# Patient Record
Sex: Male | Born: 1965 | Race: Black or African American | Hispanic: No | Marital: Married | Smoking: Never smoker
Health system: Southern US, Community
[De-identification: ages and names within clinical notes are randomized; demographics above are authoritative.]

## PROBLEM LIST (undated history)

## (undated) HISTORY — PX: HAND SURGERY: SHX662

---

## 2013-01-07 ENCOUNTER — Emergency Department (INDEPENDENT_AMBULATORY_CARE_PROVIDER_SITE_OTHER)
Admission: EM | Admit: 2013-01-07 | Discharge: 2013-01-07 | Disposition: A | Discharge status: Home / Self Care | Payer: Self-pay | Source: Home / Self Care | Attending: Family Medicine | Admitting: Family Medicine

## 2013-01-07 ENCOUNTER — Encounter (HOSPITAL_COMMUNITY): Payer: Self-pay | Admitting: Emergency Medicine

## 2013-01-07 DIAGNOSIS — K2970 Gastritis, unspecified, without bleeding: Secondary | ICD-10-CM

## 2013-01-07 DIAGNOSIS — K2990 Gastroduodenitis, unspecified, without bleeding: Secondary | ICD-10-CM

## 2013-01-07 LAB — POCT URINALYSIS DIP (DEVICE)
Bilirubin Urine: NEGATIVE
Ketones, ur: NEGATIVE mg/dL
Leukocytes, UA: NEGATIVE
Specific Gravity, Urine: 1.025 (ref 1.005–1.030)
pH: 6 (ref 5.0–8.0)

## 2013-01-07 MED ORDER — OMEPRAZOLE 20 MG PO CPDR: 20.0000 mg | DELAYED_RELEASE_CAPSULE | Freq: Every day | ORAL | Status: AC

## 2013-01-07 NOTE — ED Provider Notes (Signed)
Medical screening examination/treatment/procedure(s) were performed by non-physician practitioner and as supervising physician I was immediately available for consultation/collaboration.   MORENO-COLL,Rilynne Lonsway; MD   Syliva Mee Moreno-Coll, MD 01/07/13 1654 

## 2013-01-07 NOTE — ED Provider Notes (Signed)
History     CSN: [id number]  Arrival date & time 01/07/13  1243   None     No chief complaint on file.   (Consider location/radiation/quality/duration/timing/severity/associated sxs/prior treatment) Patient is a 47 y.o. male presenting with frequency. The history is provided by the patient. No language interpreter was used.  Urinary Frequency This is a new problem. The problem occurs constantly. The problem has been gradually worsening. Associated symptoms include abdominal pain. Nothing aggravates the symptoms. Nothing relieves the symptoms. He has tried nothing for the symptoms.  Pt complains of increased urinary frequency.   Pt reports he had a lot of belching earlier today.  Pt reports he had pain in upper abdomen that went into his chest.  History reviewed. No pertinent past medical history.  Past Surgical History  Procedure Date  . Hand surgery     No family history on file.  History  Substance Use Topics  . Smoking status: Never Smoker   . Smokeless tobacco: Not on file  . Alcohol Use: No      Review of Systems  Gastrointestinal: Positive for abdominal pain.  Genitourinary: Positive for frequency.  All other systems reviewed and are negative.    Allergies  Review of patient's allergies indicates no known allergies.  Home Medications   Current Outpatient Rx  Name  Route  Sig  Dispense  Refill  . OVER THE COUNTER MEDICATION      Over the counter antacids           BP 120/83  Pulse 72  Temp 98 F (36.7 C) (Oral)  Resp 18  SpO2 99%  Physical Exam  Nursing note and vitals reviewed. Constitutional: He appears well-developed and well-nourished.  HENT:  Head: Normocephalic.  Eyes: Conjunctivae normal and EOM are normal. Pupils are equal, round, and reactive to light.  Neck: Normal range of motion. Neck supple.  Cardiovascular: Normal rate, regular rhythm and normal heart sounds.   Pulmonary/Chest: Effort normal and breath sounds normal.    Abdominal: Soft.  Musculoskeletal: Normal range of motion.  Neurological: He is alert.  Skin: Skin is warm.    ED Course  Procedures (including critical care time)  Labs Reviewed - No data to display No results found.   No diagnosis found.    MDM    prilosec      Lonia Skinner [place], [place] 01/07/13 [phone]

## 2013-01-07 NOTE — ED Notes (Signed)
Reports belching frequently for a month.  Reports frequent urination, urgency.  Reports nausea the last few days.  Felt pressure under left breast.  Denies sob, reports cool, clammy feeling, no vomiting, some nausea.

## 2013-01-07 NOTE — ED Notes (Signed)
Warm blankets/pillow.  Patient is in a gown

## 2013-01-07 NOTE — ED Notes (Signed)
Patient instructed to place on gown, provided blankets

## 2013-01-07 NOTE — ED Notes (Signed)
Patient is resting comfortably.  Updated on physician to be returning to room, results back

## 2013-01-07 NOTE — ED Notes (Signed)
Patient is resting comfortably. 

## 2013-01-07 NOTE — ED Notes (Signed)
Patient getting dressed.

## 2013-03-05 ENCOUNTER — Emergency Department (INDEPENDENT_AMBULATORY_CARE_PROVIDER_SITE_OTHER)
Admission: EM | Admit: 2013-03-05 | Discharge: 2013-03-05 | Disposition: A | Discharge status: Home / Self Care | Payer: Self-pay | Source: Home / Self Care | Attending: Family Medicine | Admitting: Family Medicine

## 2013-03-05 ENCOUNTER — Encounter (HOSPITAL_COMMUNITY): Payer: Self-pay | Admitting: Emergency Medicine

## 2013-03-05 DIAGNOSIS — R29898 Other symptoms and signs involving the musculoskeletal system: Secondary | ICD-10-CM

## 2013-03-05 DIAGNOSIS — M25539 Pain in unspecified wrist: Secondary | ICD-10-CM

## 2013-03-05 DIAGNOSIS — M25531 Pain in right wrist: Secondary | ICD-10-CM

## 2013-03-05 NOTE — ED Provider Notes (Signed)
History     CSN: [id number]  Arrival date & time 03/05/13  1528   First MD Initiated Contact with Patient 03/05/13 1728      Chief Complaint  Patient presents with  . Wrist Pain    (Consider location/radiation/quality/duration/timing/severity/associated sxs/prior treatment) HPI Comments: Pt reports he shot a nail through the anterior of his R wrist in 2011 with a nail gun.  Had it removed but never sought follow up.  It has been causing him pain on and off with active use of wrist that is getting worse.  And now pt describes wrist weakness, not being able to use wrist as he can the L.  Also reports sometimes R hand feels numb or tingly.  Pt reports sx are getting more frequent and worse, and are interfering with his job performance.   Patient is a 47 y.o. male presenting with wrist pain. The history is provided by the patient.  Wrist Pain This is a chronic problem. Episode onset: 3 years ago. The problem occurs every several days. The problem has been gradually worsening. The symptoms are aggravated by exertion. The symptoms are relieved by rest. He has tried rest for the symptoms. Improvement on treatment: transient relief.    History reviewed. No pertinent past medical history.  Past Surgical History  Procedure Laterality Date  . Hand surgery      History reviewed. No pertinent family history.  History  Substance Use Topics  . Smoking status: Never Smoker   . Smokeless tobacco: Not on file  . Alcohol Use: No      Review of Systems  Constitutional: Negative for fever and chills.  Musculoskeletal:       Wrist pain  Skin: Negative for color change and wound.  Neurological: Positive for weakness and numbness.    Allergies  Review of patient's allergies indicates no known allergies.  Home Medications   Current Outpatient Rx  Name  Route  Sig  Dispense  Refill  . omeprazole (PRILOSEC) 20 MG capsule   Oral   Take 1 capsule (20 mg total) by mouth daily.   30  capsule   0   . OVER THE COUNTER MEDICATION      Over the counter antacids           BP 117/75  Pulse 74  Temp(Src) 98.3 F (36.8 C) (Oral)  Resp 12  SpO2 100%  Physical Exam  Constitutional: He appears well-developed and well-nourished. No distress.  Cardiovascular:  Pulses:      Radial pulses are 2+ on the right side.  Musculoskeletal:       Right wrist: Normal.  Neurological: He has normal strength. No sensory deficit.  Skin: Skin is warm, dry and intact.    ED Course  Procedures (including critical care time)  Labs Reviewed - No data to display No results found.   1. Wrist pain, right   2. Wrist weakness       MDM  I find no abnormalities on exam today, but given pt's sx and hx of injury, will refer to hand for further eval.         Cathlyn Parsons, NP 03/05/13 1742

## 2013-03-05 NOTE — ED Notes (Addendum)
Pt C/o pain in right wrist associated to a previous injury. Pain onset in January but is getting worse due to his line of work. Pain radiates from wrist to elbows. No numbness or tingling in fingers. Taking OTC Aleve with relief. No fever. Patient is alert and oriented.

## 2013-03-09 NOTE — ED Provider Notes (Signed)
Medical screening examination/treatment/procedure(s) were performed by resident physician or non-physician practitioner and as supervising physician I was immediately available for consultation/collaboration.   Bhakti Labella DOUGLAS MD.   Kenlee Vogt D Mady Oubre, MD 03/09/13 1017 

## 2021-03-11 ENCOUNTER — Emergency Department (HOSPITAL_COMMUNITY): Payer: BLUE CROSS/BLUE SHIELD

## 2021-03-11 ENCOUNTER — Emergency Department (HOSPITAL_COMMUNITY)
Admission: EM | Admit: 2021-03-11 | Discharge: 2021-03-11 | Disposition: A | Discharge status: Home / Self Care | Payer: BLUE CROSS/BLUE SHIELD | Attending: Emergency Medicine | Admitting: Emergency Medicine

## 2021-03-11 ENCOUNTER — Other Ambulatory Visit: Payer: Self-pay

## 2021-03-11 DIAGNOSIS — N433 Hydrocele, unspecified: Secondary | ICD-10-CM | POA: Insufficient documentation

## 2021-03-11 DIAGNOSIS — N400 Enlarged prostate without lower urinary tract symptoms: Secondary | ICD-10-CM

## 2021-03-11 DIAGNOSIS — N50812 Left testicular pain: Secondary | ICD-10-CM

## 2021-03-11 LAB — URINALYSIS, ROUTINE W REFLEX MICROSCOPIC
Bilirubin Urine: NEGATIVE
Glucose, UA: NEGATIVE mg/dL
Hgb urine dipstick: NEGATIVE
Ketones, ur: NEGATIVE mg/dL
Leukocytes,Ua: NEGATIVE
Nitrite: NEGATIVE
Protein, ur: NEGATIVE mg/dL
Specific Gravity, Urine: 1.018 (ref 1.005–1.030)
pH: 5 (ref 5.0–8.0)

## 2021-03-11 MED ORDER — CELECOXIB 200 MG PO CAPS: 200.0000 mg | ORAL_CAPSULE | Freq: Two times a day (BID) | ORAL | 0 refills | Status: AC

## 2021-03-11 NOTE — ED Provider Notes (Signed)
[facility] EMERGENCY DEPARTMENT Provider Note   CSN: [id number] Arrival date & time: 03/11/21  8250     History Chief Complaint  Patient presents with  . Testicle Pain    Ryan Hooper is a 55 y.o. male.  Pt here with R testicle pain. Onset 6 days ago. Constant, moderate, aching, worse w/ standing, better when lying down. Not currently sexually active. No urinary sxs, discharge, groin bulge, heavy lifting, fever, back pain, penile discharge or genital lesions. Not currently sexually active.  The history is provided by the patient. No language interpreter was used.  Testicle Pain This is a new problem. Episode onset: 6 days. The problem occurs constantly. The problem has not changed since onset.Pertinent negatives include no chest pain, no abdominal pain and no headaches. The symptoms are aggravated by standing. The symptoms are relieved by lying down. He has tried nothing for the symptoms.       No past medical history on file.  There are no problems to display for this patient.   Past Surgical History:  Procedure Laterality Date  . HAND SURGERY         No family history on file.  Social History   Tobacco Use  . Smoking status: Never Smoker  Substance Use Topics  . Alcohol use: No  . Drug use: No    Home Medications Prior to Admission medications   Medication Sig Start Date End Date Taking? Authorizing Provider  omeprazole (PRILOSEC) 20 MG capsule Take 1 capsule (20 mg total) by mouth daily. 01/07/13   Elson Areas, PA-C  OVER THE COUNTER MEDICATION Over the counter antacids    [provider]    Allergies    Patient has no known allergies.  Review of Systems   Review of Systems  Constitutional: Negative.   HENT: Negative.   Eyes: Negative.   Respiratory: Negative.   Cardiovascular: Negative for chest pain.  Gastrointestinal: Negative for abdominal pain.  Genitourinary: Positive for testicular pain. Negative for  dysuria, flank pain, genital sores, hematuria, penile discharge, penile swelling, scrotal swelling and urgency.  Musculoskeletal: Negative.   Skin: Negative for color change and wound.  Neurological: Negative for headaches.    Physical Exam Updated Vital Signs BP (!) 125/96   Pulse 93   Temp 98.3 F (36.8 C) (Oral)   Resp 16   SpO2 99%   Physical Exam Vitals and nursing note reviewed. Exam conducted with a chaperone present.  Constitutional:      General: He is not in acute distress.    Appearance: He is well-developed. He is not diaphoretic.  HENT:     Head: Normocephalic and atraumatic.  Eyes:     General: No scleral icterus.    Conjunctiva/sclera: Conjunctivae normal.  Cardiovascular:     Rate and Rhythm: Normal rate and regular rhythm.     Heart sounds: Normal heart sounds.  Pulmonary:     Effort: Pulmonary effort is normal. No respiratory distress.     Breath sounds: Normal breath sounds.  Abdominal:     Palpations: Abdomen is soft.     Tenderness: There is no abdominal tenderness.     Hernia: There is no hernia in the right inguinal area. Left inguinal: No palpable hernia, but patient with exquisite tenderness on the left side   Genitourinary:    Penis: Circumcised.      Testes:        Right: Mass, tenderness or swelling not present. Cremasteric reflex is present.  Left: Mass, tenderness or swelling not present. Left testis is descended.     Epididymis:     Right: Normal.     Left: Normal.  Musculoskeletal:     Cervical back: Normal range of motion and neck supple.  Lymphadenopathy:     Lower Body: No right inguinal adenopathy. No left inguinal adenopathy.  Skin:    General: Skin is warm and dry.  Neurological:     Mental Status: He is alert.  Psychiatric:        Behavior: Behavior normal.     ED Results / Procedures / Treatments   Labs (all labs ordered are listed, but only abnormal results are displayed) Labs Reviewed  URINALYSIS, ROUTINE W  REFLEX MICROSCOPIC  GC/CHLAMYDIA PROBE AMP ([place]) NOT AT [facility]    EKG None  Radiology No results found.  Procedures Procedures   Medications Ordered in ED Medications - No data to display  ED Course  I have reviewed the triage vital signs and the nursing notes.  Pertinent labs & imaging results that were available during my care of the patient were reviewed by me and considered in my medical decision making (see chart for details).  Clinical Course as of 03/11/21 1017  Sun Mar 11, 2021  1016 [place] + hydrocele, -for torsion or epididymitis. Discussed findings of UA (negative) and [place] w/ patient, will proceed with non-con CT pelvis for ? Inguinal hernia [AH]    Clinical Course User Index [AH] Arthor Captain, PA-C   MDM Rules/Calculators/A&P                          Patient here with left testicle pain specifically pain within the inguinal canal.  I ordered and reviewed labs which included a urinalysis, GC chlamydia probe sent.  I ordered and reviewed a scrotal ultrasound which shows no evidence of torsion, epididymitis.  He does have a small hydrocele.  CT of the pelvis without evidence of inguinal hernia and no clinical evidence on physical examination.  I am unsure of the etiology of the patient's pain however have asked that the patient follow-up with his urologist.  He has a urologist in [facility] where he lives.  The patient will be discharged with anti-inflammatory medications.  Have advised that he use supportive underwear.  He appears otherwise appropriate for discharge at this time Final Clinical Impression(s) / ED Diagnoses Final diagnoses:  None    Rx / DC Orders ED Discharge Orders    None       Arthor Captain, PA-C 03/12/21 2007    Rolan Bucco, MD 03/13/21 1501

## 2021-03-11 NOTE — Discharge Instructions (Addendum)
Your ultrasound and CT scan were negative for any emergent cause of pain.  There is no findings on either your urinalysis, your CT scan or your ultrasound to explain the reason you are having the pain.  There is not appear to be any life limb or organ threatening cause.  You will need to follow-up closely with urology for further evaluation.  I have given you a referral to [facility].  Dr. Arita Miss is currently covering however you may see whomever can see you first there.  In the meantime I am discharging you with an anti-inflammatory medication which may help reduce your pain symptoms.  It may also be helpful to use supportive underwear to help elevate the testicle.  Please return for any new, worsening or concerning symptoms.  Get help right away if you: Develop a lot of pain or your pain becomes worse. Have shaking chills. Have a high fever.

## 2021-03-11 NOTE — ED Notes (Signed)
Patient transported to US 

## 2021-03-11 NOTE — ED Triage Notes (Signed)
Pain brought to ED to via POV with c/o testicular pain since Tuesday. Pain rated 5/10, exacerbated with movement.

## 2021-03-12 LAB — GC/CHLAMYDIA PROBE AMP (~~LOC~~) NOT AT ARMC
Chlamydia: NEGATIVE
Comment: NEGATIVE
Comment: NORMAL
Neisseria Gonorrhea: NEGATIVE

## 2022-11-23 IMAGING — US US SCROTUM W/ DOPPLER COMPLETE
1 series · 14 of 25 positions shown · non-contrast
Comparison: None.

CLINICAL DATA: Left testicular pain

EXAM:
SCROTAL ULTRASOUND
DOPPLER ULTRASOUND OF THE TESTICLES
TECHNIQUE: Complete ultrasound examination of the testicles, epididymis, and
other scrotal structures was performed. Color and spectral Doppler
ultrasound were also utilized to evaluate blood flow to the
testicles.

[Series 1: us scrotum w/doppler · 14 of 56 slices shown]
[im 1/56]
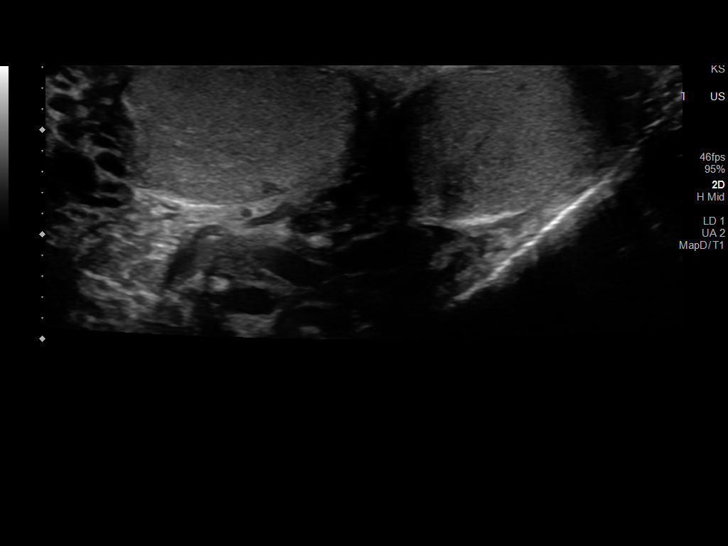
[im 5/56]
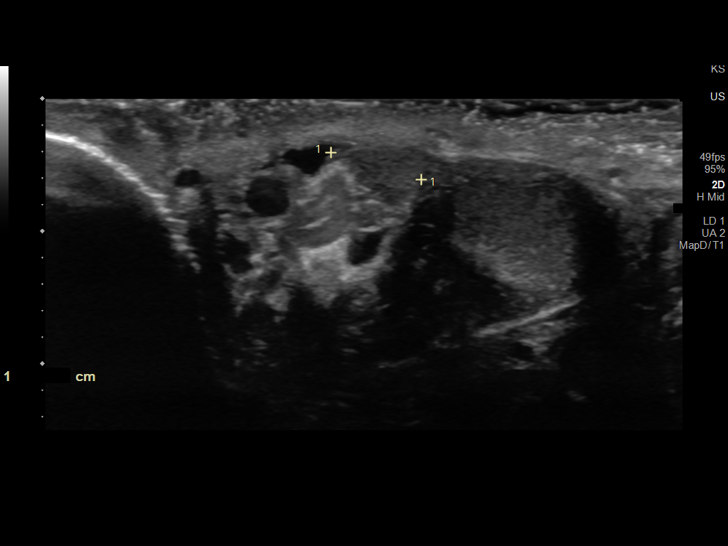
[im 10/56]
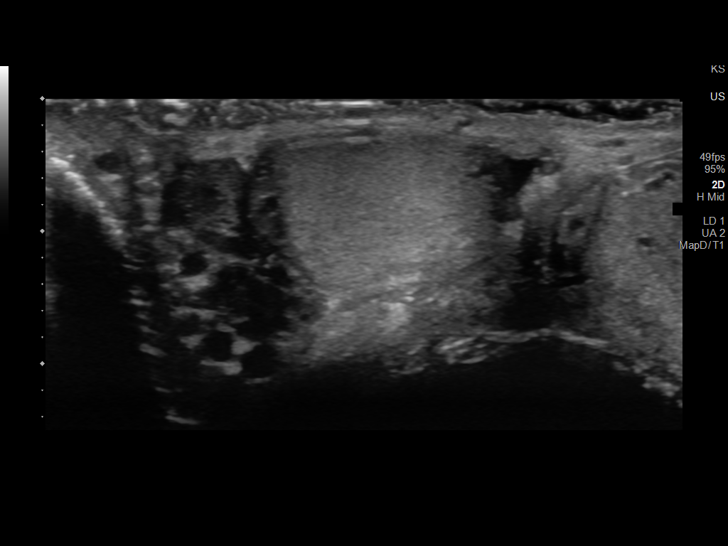
[im 14/56]
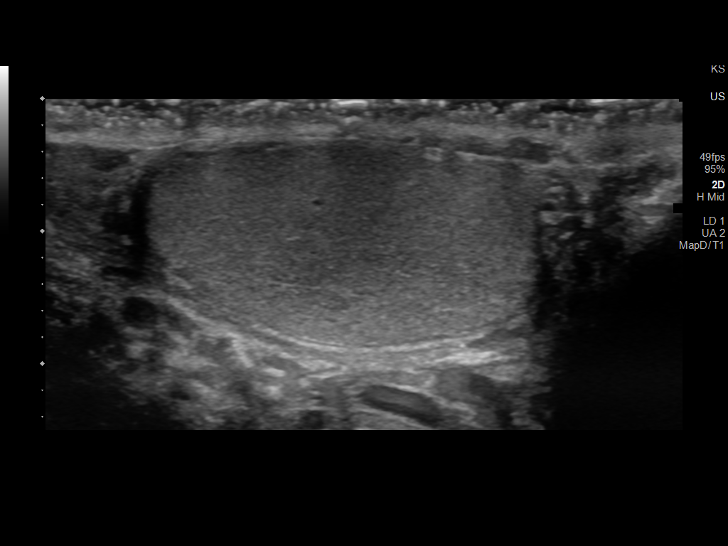
[im 19/56]
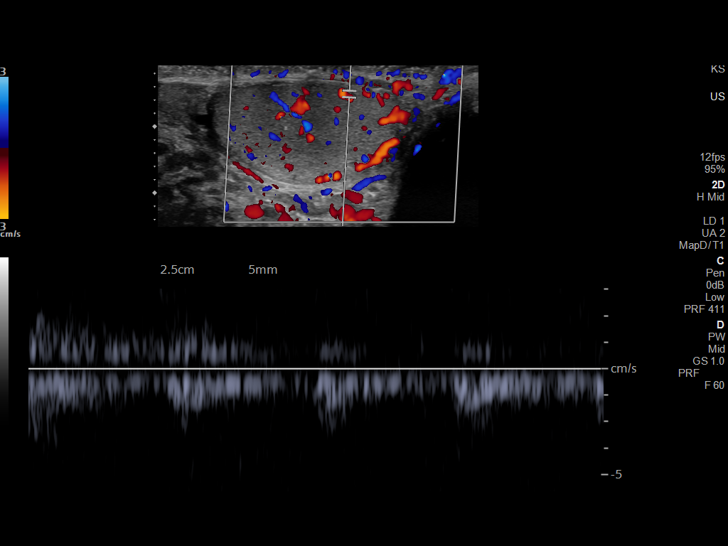
[im 21/56]
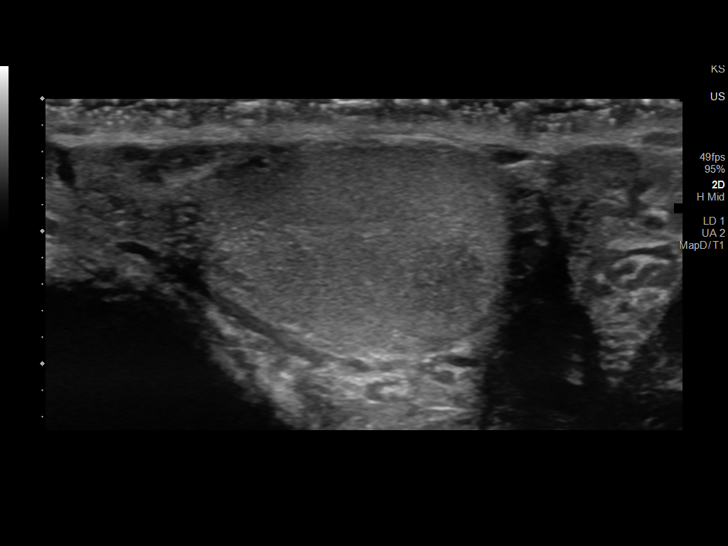
[im 26/56]
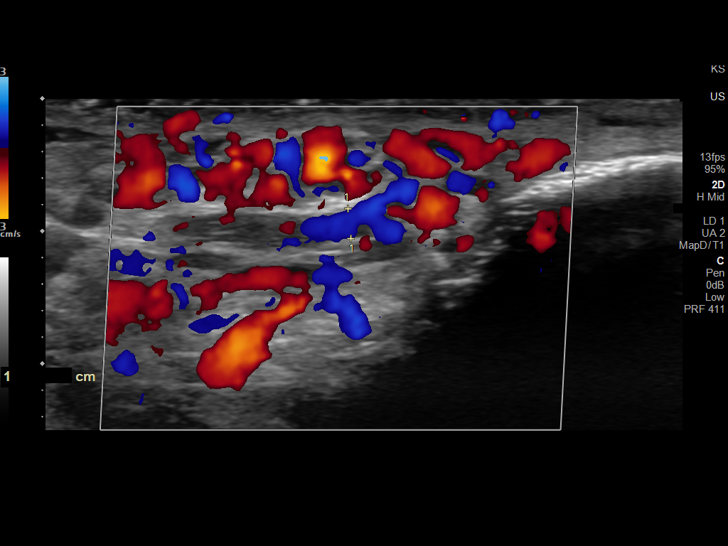
[im 30/56]
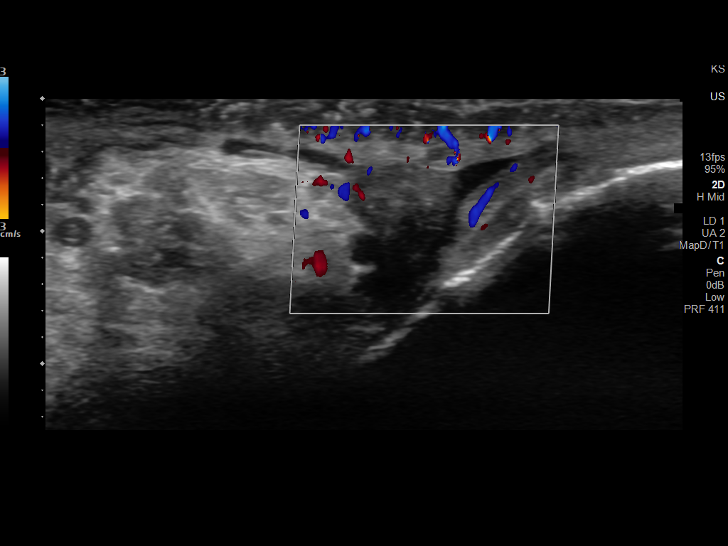
[im 35/56]
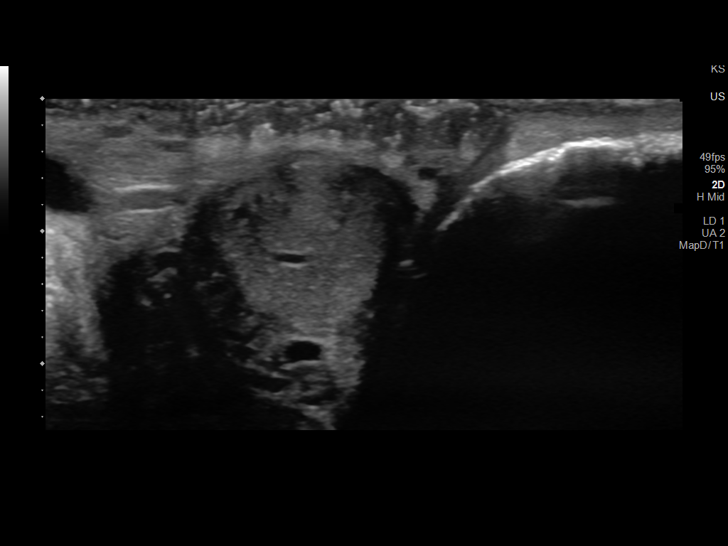
[im 37/56]
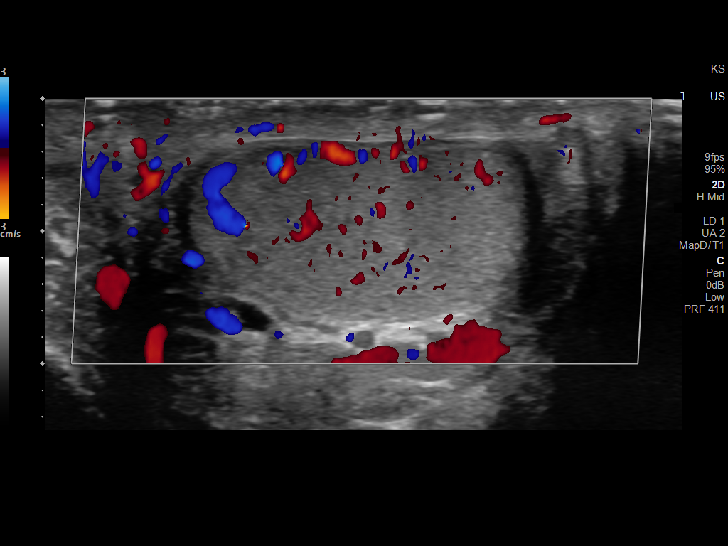
[im 42/56]
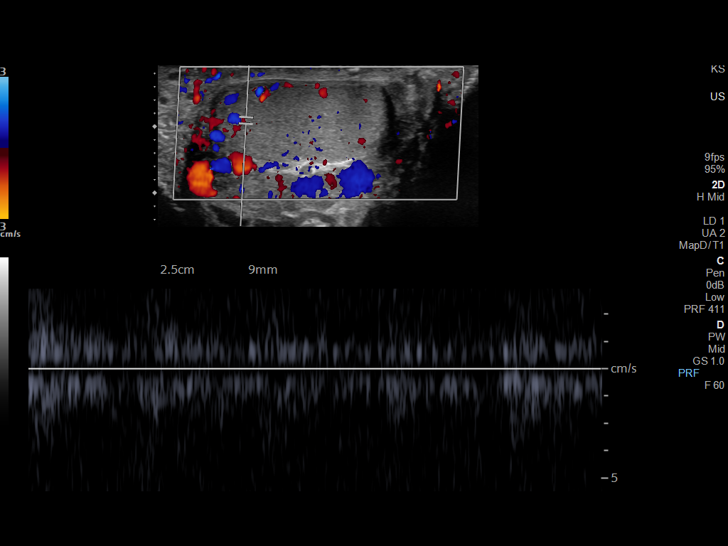
[im 46/56]
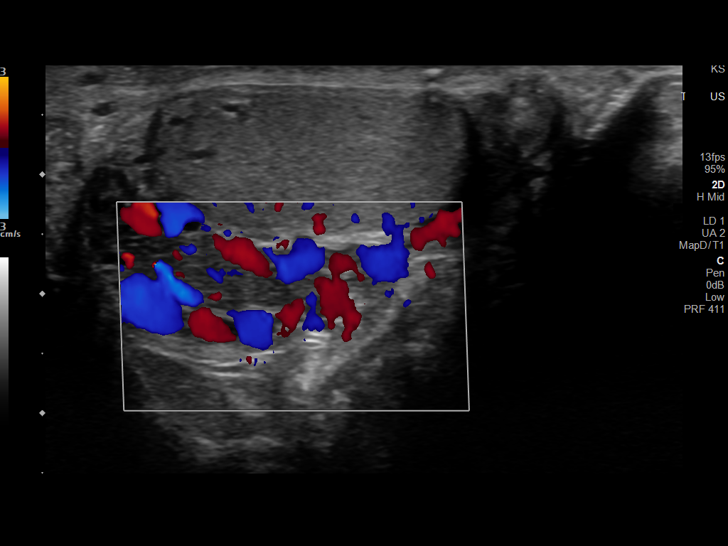
[im 51/56]
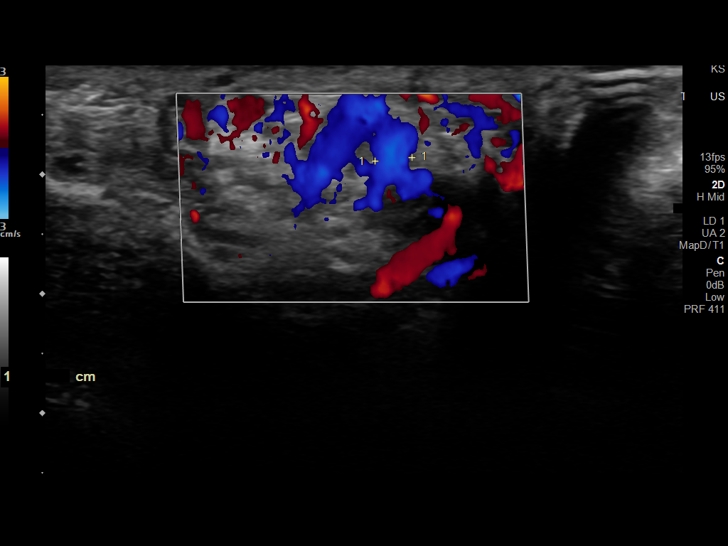
[im 56/56]
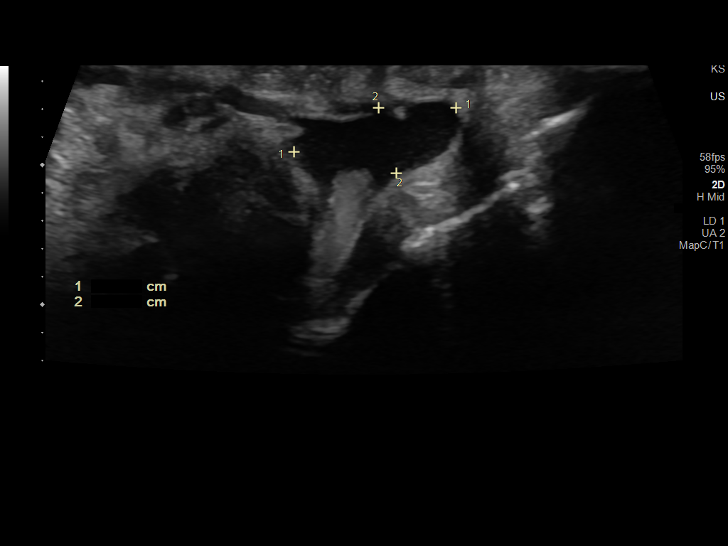

[14 of 25 positions shown; findings below may reference images not displayed]

FINDINGS: Right testicle

Measurements: 2.9 x 1.6 x 2.3 cm. No mass or microlithiasis
visualized.

Left testicle

Measurements: 2.6 x 1.2 x 1.7 cm. No mass or microlithiasis
visualized.

Right epididymis:  Normal in size and appearance.

Left epididymis:  Normal in size and appearance.

Hydrocele:  Small left hydrocele.

Varicocele:  None visualized.

Pulsed Doppler interrogation of both testes demonstrates normal low
resistance arterial and venous waveforms bilaterally.
IMPRESSION: 1. Normal size and ultrasound appearance of the bilateral testicles.
Arterial and venous Doppler flow is present bilaterally.
2. Small, nonspecific left hydrocele.
